# Patient Record
Sex: Male | Born: 1985 | Race: White | Hispanic: No | Marital: Single | Smoking: Never smoker
Health system: Southern US, Community
[De-identification: ages and names within clinical notes are randomized; demographics above are authoritative.]

---

## 2015-08-31 ENCOUNTER — Emergency Department (HOSPITAL_COMMUNITY)
Admission: EM | Admit: 2015-08-31 | Discharge: 2015-08-31 | Disposition: A | Discharge status: Home / Self Care | Payer: Self-pay | Attending: Emergency Medicine | Admitting: Emergency Medicine

## 2015-08-31 ENCOUNTER — Emergency Department (HOSPITAL_COMMUNITY): Payer: Self-pay

## 2015-08-31 ENCOUNTER — Encounter (HOSPITAL_COMMUNITY): Payer: Self-pay | Admitting: Emergency Medicine

## 2015-08-31 DIAGNOSIS — R0789 Other chest pain: Secondary | ICD-10-CM | POA: Insufficient documentation

## 2015-08-31 LAB — BASIC METABOLIC PANEL
ANION GAP: 5 (ref 5–15)
BUN: 10 mg/dL (ref 6–20)
CHLORIDE: 106 mmol/L (ref 101–111)
CO2: 27 mmol/L (ref 22–32)
Calcium: 8.9 mg/dL (ref 8.9–10.3)
Creatinine, Ser: 0.93 mg/dL (ref 0.61–1.24)
GFR calc Af Amer: >60 mL/min (ref >60)
GLUCOSE: 100 mg/dL — AB (ref 65–99)
POTASSIUM: 3.9 mmol/L (ref 3.5–5.1)
SODIUM: 138 mmol/L (ref 135–145)

## 2015-08-31 LAB — D-DIMER, QUANTITATIVE (NOT AT ARMC)

## 2015-08-31 LAB — CBC
HEMATOCRIT: 47.9 % (ref 39.0–52.0)
HEMOGLOBIN: 16.9 g/dL (ref 13.0–17.0)
MCH: 29.6 pg (ref 26.0–34.0)
MCHC: 35.3 g/dL (ref 30.0–36.0)
MCV: 83.9 fL (ref 78.0–100.0)
Platelets: 212 10*3/uL (ref 150–400)
RBC: 5.71 MIL/uL (ref 4.22–5.81)
RDW: 12.2 % (ref 11.5–15.5)
WBC: 9.6 10*3/uL (ref 4.0–10.5)

## 2015-08-31 LAB — I-STAT TROPONIN, ED: Troponin i, poc: 0 ng/mL (ref 0.00–0.08)

## 2015-08-31 MED ORDER — NAPROXEN 250 MG PO TABS: 250.0000 mg | ORAL_TABLET | Freq: Two times a day (BID) | ORAL | 0 refills | Status: AC | PRN

## 2015-08-31 MED ORDER — ACETAMINOPHEN 325 MG PO TABS
650.0000 mg | ORAL_TABLET | Freq: Once | ORAL | Status: AC
Start: 2015-08-31 — End: 2015-08-31
  Administered 2015-08-31: 650 mg via ORAL
  Filled 2015-08-31: qty 2

## 2015-08-31 MED ORDER — METHOCARBAMOL 500 MG PO TABS: 1000.0000 mg | ORAL_TABLET | Freq: Four times a day (QID) | ORAL | 0 refills | Status: AC | PRN

## 2015-08-31 MED ORDER — IBUPROFEN 400 MG PO TABS
400.0000 mg | ORAL_TABLET | Freq: Once | ORAL | Status: AC
  Administered 2015-08-31: 400 mg via ORAL
  Filled 2015-08-31: qty 1

## 2015-08-31 NOTE — ED Notes (Signed)
Pt reports chest pain for several weeks. Tonight he states he was lightheaded and short of breath. He points to mid chest as pain site.

## 2015-08-31 NOTE — ED Provider Notes (Signed)
[facility]EM[facility]PT Provider Note   CSN: [id number] Arrival date & time: 08/31/15  4098  First Provider Contact:  None       History   Chief Complaint Chief Complaint  Patient presents with  . Chest Pain    for several weeks    HPI Stanley Hernandez is a 30 y.o. male.  HPI  Pt was seen at 0715. Per pt, c/o gradual onset and persistence of constant chest "pain" for the past 2+ weeks. Pt describes the CP as "nagging," located at his left parasternal area. Has been associated with SOB. Pt has not taken any medications to treat his symptoms. Denies palpitations, no cough, no abd pain, no N/V/D, no back pain, no fevers, no rash.     History reviewed. No pertinent past medical history.  There are no active problems to display for this patient.   History reviewed. No pertinent surgical history.    Home Medications    Prior to Admission medications   Not on File    Family History History reviewed. No pertinent family history.  Social History Social History  Substance Use Topics  . Smoking status: Never Smoker  . Smokeless tobacco: Never Used  . Alcohol use No     Allergies   Review of patient's allergies indicates no known allergies.   Review of Systems Review of Systems ROS: Statement: All systems negative except as marked or noted in the HPI; Constitutional: Negative for fever and chills. ; ; Eyes: Negative for eye pain, redness and discharge. ; ; ENMT: Negative for ear pain, hoarseness, nasal congestion, sinus pressure and sore throat. ; ; Cardiovascular: Negative for palpitations, diaphoresis, and peripheral edema. ; ; Respiratory: +SOB. Negative for cough, wheezing and stridor. ; ; Gastrointestinal: Negative for nausea, vomiting, diarrhea, abdominal pain, blood in stool, hematemesis, jaundice and rectal bleeding. . ; ; Genitourinary: Negative for dysuria, flank pain and hematuria. ; ; Musculoskeletal: +chest wall pain. Negative for back pain and neck pain.  Negative for swelling and trauma.; ; Skin: Negative for pruritus, rash, abrasions, blisters, bruising and skin lesion.; ; Neuro: Negative for headache, lightheadedness and neck stiffness. Negative for weakness, altered level of consciousness, altered mental status, extremity weakness, paresthesias, involuntary movement, seizure and syncope.      Physical Exam Updated Vital Signs BP 120/94   Pulse 71   Temp 98.1 F (36.7 C) (Oral)   Resp 24   Ht  (1.778 m)   Wt 230 lb (104.3 kg)   SpO2 100%   BMI 33.00 kg/m   Physical Exam 0720: Physical examination:  Nursing notes reviewed; Vital signs and O2 SAT reviewed;  Constitutional: Well developed, Well nourished, Well hydrated, In no acute distress; Head:  Normocephalic, atraumatic; Eyes: EOMI, PERRL, No scleral icterus; ENMT: Mouth and pharynx normal, Mucous membranes moist; Neck: Supple, Full range of motion, No lymphadenopathy; Cardiovascular: Regular rate and rhythm, No murmur, rub, or gallop; Respiratory: Breath sounds clear & equal bilaterally, No rales, rhonchi, wheezes.  Speaking full sentences with ease, Normal respiratory effort/excursion; Chest: +left lower parasternal area tender to palp. No deformity, no soft tissue crepitus. Movement normal; Abdomen: Soft, Nontender, Nondistended, Normal bowel sounds; Genitourinary: No CVA tenderness; Extremities: Pulses normal, No tenderness, No edema, No calf edema or asymmetry.; Neuro: AA&Ox3, Major CN grossly intact.  Speech clear. No gross focal motor or sensory deficits in extremities.; Skin: Color normal, Warm, Dry.; Psych:  Anxious.     ED Treatments / Results  Labs (all labs ordered are listed,  but only abnormal results are displayed)   EKG  EKG Interpretation  Date/Time:  Sunday August 31 2015 06:09:43 EDT Ventricular Rate:  74 PR Interval:    QRS Duration: 96 QT Interval:  389 QTC Calculation: 432 R Axis:   62 Text Interpretation:  Sinus rhythm Low voltage, precordial leads  No old tracing to compare Confirmed by KNAPP  MD-I, IVA ([id number]) on 08/31/2015 6:22:13 AM       Radiology   Procedures Procedures (including critical care time)  Medications Ordered in ED    Initial Impression / Assessment and Plan / ED Course  I have reviewed the triage vital signs and the nursing notes.  Pertinent labs & imaging results that were available during my care of the patient were reviewed by me and considered in my medical decision making (see chart for details).  MDM Reviewed: nursing note and vitals Interpretation: labs, ECG and x-ray   Results for orders placed or performed during the hospital encounter of 08/31/15  Basic metabolic panel  Result Value Ref Range   Sodium 138 135 - 145 mmol/L   Potassium 3.9 3.5 - 5.1 mmol/L   Chloride 106 101 - 111 mmol/L   CO2 27 22 - 32 mmol/L   Glucose, Bld 100 (H) 65 - 99 mg/dL   BUN 10 6 - 20 mg/dL   Creatinine, Ser 6.040.93 0.61 - 1.24 mg/dL   Calcium 8.9 8.9 - 54.010.3 mg/dL   GFR calc non Af Amer >60 >60 mL/min   GFR calc Af Amer >60 >60 mL/min   Anion gap 5 5 - 15  CBC  Result Value Ref Range   WBC 9.6 4.0 - 10.5 K/uL   RBC 5.71 4.22 - 5.81 MIL/uL   Hemoglobin 16.9 13.0 - 17.0 g/dL   HCT 98.147.9 19.139.0 - 47.852.0 %   MCV 83.9 78.0 - 100.0 fL   MCH 29.6 26.0 - 34.0 pg   MCHC 35.3 30.0 - 36.0 g/dL   RDW 29.512.2 62.111.5 - 30.815.5 %   Platelets 212 150 - 400 K/uL  D-dimer, quantitative  Result Value Ref Range   D-Dimer, Quant <0.27 0.00 - 0.50 ug/mL-FEU  I-stat troponin, ED  Result Value Ref Range   Troponin i, poc 0.00 0.00 - 0.08 ng/mL   Comment 3           Dg Chest 2 View Result Date: 08/31/2015 CLINICAL DATA:  Subacute onset of mid chest pain. Acute onset of shortness of breath and lightheadedness. Initial encounter. EXAM: CHEST  2 VIEW COMPARISON:  None. FINDINGS: The lungs are well-aerated and clear. There is no evidence of focal opacification, pleural effusion or pneumothorax. The heart is normal in size; the mediastinal  contour is within normal limits. No acute osseous abnormalities are seen. IMPRESSION: No acute cardiopulmonary process seen. Electronically Signed   By: Roanna RaiderJeffery  Chang M.D.   On: 08/31/2015 06:57    0820:  Doubt PE as cause for symptoms with normal d-dimer and low risk Wells.  Doubt ACS as cause for symptoms with normal troponin and unchanged EKG from previous after 2 weeks of constant symptoms. Tx symptomatically. Dx and testing d/w pt.  Questions answered.  Verb understanding, agreeable to d/c home with outpt f/u.      Final Clinical Impressions(s) / ED Diagnoses   Final diagnoses:  None    New Prescriptions New Prescriptions   No medications on file     Samuel JesterKathleen Seneca Hoback, DO 09/02/15 1651

## 2015-08-31 NOTE — Discharge Instructions (Signed)
Take the prescriptions as directed. Take over the counter tylenol, as directed on packaging, as needed for discomfort. Apply moist heat or ice to the area(s) of discomfort, for 15 minutes at a time, several times per day for the next few days.  Do not fall asleep on a heating or ice pack.  Call your regular medical doctor on Monday to schedule a follow up appointment this week.  Return to the Emergency Department immediately if worsening.

## 2015-08-31 NOTE — ED Notes (Signed)
Patient transported to X-ray 

## 2015-08-31 NOTE — ED Notes (Signed)
Pt from radiology

## 2016-09-23 ENCOUNTER — Ambulatory Visit (INDEPENDENT_AMBULATORY_CARE_PROVIDER_SITE_OTHER): Payer: Self-pay | Admitting: Orthopaedic Surgery

## 2016-09-23 ENCOUNTER — Encounter (INDEPENDENT_AMBULATORY_CARE_PROVIDER_SITE_OTHER): Payer: Self-pay | Admitting: Orthopaedic Surgery

## 2016-09-23 VITALS — BP 153/75 | HR 75 | Ht 70.0 in | Wt 235.0 lb

## 2016-09-23 DIAGNOSIS — S62337A Displaced fracture of neck of fifth metacarpal bone, left hand, initial encounter for closed fracture: Secondary | ICD-10-CM

## 2016-09-23 NOTE — Progress Notes (Signed)
   Office Visit Note   Patient: Stanley Hernandez           Date of Birth: [birth year]           MRN: [id number] Visit Date: 09/23/2016              Requested by: Stanley referring provider defined for this encounter. PCP: Patient, Stanley Hernandez   Assessment & Plan: Visit Diagnoses:  1. Closed displaced fracture of neck of fifth metacarpal bone of left hand, initial encounter     Plan: Ulnar gutter splint applied with the wrist extended MCPs flexed. I'll recheck him in 3 weeks for slight removal we discussed splint care he'll keep it dry. Splint removal and exam on return we will wait on x-rays.  Follow-Up Instructions: Return in about 3 weeks (around 10/14/2016).   Orders:  Stanley orders of the defined types were placed in this encounter.  Stanley orders of the defined types were placed in this encounter.     Procedures: Stanley procedures performed   Clinical Data: Stanley additional findings.   Subjective: Chief Complaint  Patient presents with  . Left Hand - Pain    HPI 31 year old male had a wall with his left dominant hand in anger suffering a left fifth metacarpal fracture. He is placed in a splint injury date was 09/19/2016 on Sunday. 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 He is using a splint using anti-inflammatories.  Review of Systems patient's been young and healthy Stanley previous injury to his left hand. Stanley previous surgeries or significant hospitalizations.   Objective: Vital Signs: BP (!) 153/75   Pulse 75   Ht 5\' 10"  (1.778 m)   Wt 235 lb (106.6 kg)   BMI 33.72 kg/m   Physical Exam  Constitutional: He is oriented to person, place, and time. He appears well-developed and well-nourished.  HENT:  Head: Normocephalic and atraumatic.  Eyes: Pupils are equal, round, and reactive to light. EOM are normal.  Neck: Stanley tracheal deviation present. Stanley thyromegaly present.  Cardiovascular: Normal rate.   Pulmonary/Chest: Effort normal. He has Stanley wheezes.  Abdominal: Soft. Bowel  sounds are normal.  Musculoskeletal:  Patient is diffuse dorsal hand swelling. There is some ecchymosis laterally over the hyperthenar. Face and the fingertip is intact. Tenderness over the fifth metacarpal neck. Stanley rotational deformity.  Neurological: He is alert and oriented to person, place, and time.  Skin: Skin is warm and dry. Capillary refill takes less than 2 seconds.  Psychiatric: He has a normal mood and affect. His behavior is normal. Judgment and thought content normal.    Ortho Exam  Specialty Comments:  Stanley specialty comments available.  Imaging: Stanley results found.   PMFS History: There are Stanley active problems to display for this patient.  Stanley past medical history on file.  Stanley family history on file.  Stanley past surgical history on file. Social History   Occupational History  . Not on file.   Social History Main Topics  . Smoking status: Never Smoker  . Smokeless tobacco: Never Used  . Alcohol use Stanley  . Drug use: Stanley  . Sexual activity: Not on file

## 2016-10-14 ENCOUNTER — Encounter (INDEPENDENT_AMBULATORY_CARE_PROVIDER_SITE_OTHER): Payer: Self-pay | Admitting: Orthopaedic Surgery

## 2016-10-14 ENCOUNTER — Ambulatory Visit (INDEPENDENT_AMBULATORY_CARE_PROVIDER_SITE_OTHER): Payer: Self-pay

## 2016-10-14 ENCOUNTER — Ambulatory Visit (INDEPENDENT_AMBULATORY_CARE_PROVIDER_SITE_OTHER): Payer: Self-pay | Admitting: Orthopaedic Surgery

## 2016-10-14 VITALS — BP 129/72 | HR 82

## 2016-10-14 DIAGNOSIS — S62337D Displaced fracture of neck of fifth metacarpal bone, left hand, subsequent encounter for fracture with routine healing: Secondary | ICD-10-CM

## 2016-10-14 NOTE — Progress Notes (Signed)
   Office Visit Note   Patient: Stanley Hernandez           Date of Birth: 1985/04/12           MRN: [id number] Visit Date: 10/14/2016              Requested by: No referring provider defined for this encounter. PCP: Patient, No Pcp Per   Assessment & Plan: Visit Diagnoses:  1. Closed displaced fracture of neck of fifth metacarpal bone of left hand with routine healing, subsequent encounter     Plan:  Gutter splint applied. He can work on finger range of motion taping the small finger to the ring finger. 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  Follow-Up Instructions: No Follow-up on file.   Orders:  Orders Placed This Encounter  Procedures  . XR Hand Complete Left   No orders of the defined types were placed in this encounter.     Procedures: No procedures performed   Clinical Data: No additional findings.   Subjective: Chief Complaint  Patient presents with  . Left Hand - Fracture, Follow-up    HPI follow boxer's fracture left hand. Splint is been using his been compliant its removed and has significant wear. The splint applied. Sensation the fingertip is intact he still has some tenderness fracture site.  Review of Systems unchanged from last office visit.   Objective: Vital Signs: BP 129/72   Pulse 82   Physical Exam  Ortho Exam  Specialty Comments:  No specialty comments available.  Imaging: Xr Hand Complete Left  Result Date: 10/14/2016 Three-view x-rays left hand demonstrates fifth metacarpal neck fracture left hand. Interval healing no increase in angulation. Slight soft tissue dorsal swelling noted Impression healing boxer's fracture left hand    PMFS History: There are no active problems to display for this patient.  No past medical history on file.    No family history on file.  No past surgical history on file. Social History   Occupational History  . Not on file.   Social History Main Topics  . Smoking status: Never Smoker  . Smokeless tobacco: Never Used  . Alcohol use No  . Drug use: No  . Sexual activity: Not on file

## 2017-02-14 IMAGING — DX DG CHEST 2V
2 series · 2 of 2 positions shown · non-contrast
Comparison: None.

CLINICAL DATA: Subacute onset of mid chest pain. Acute onset of
shortness of breath and lightheadedness. Initial encounter.

EXAM:
CHEST  2 VIEW

[chest pa]
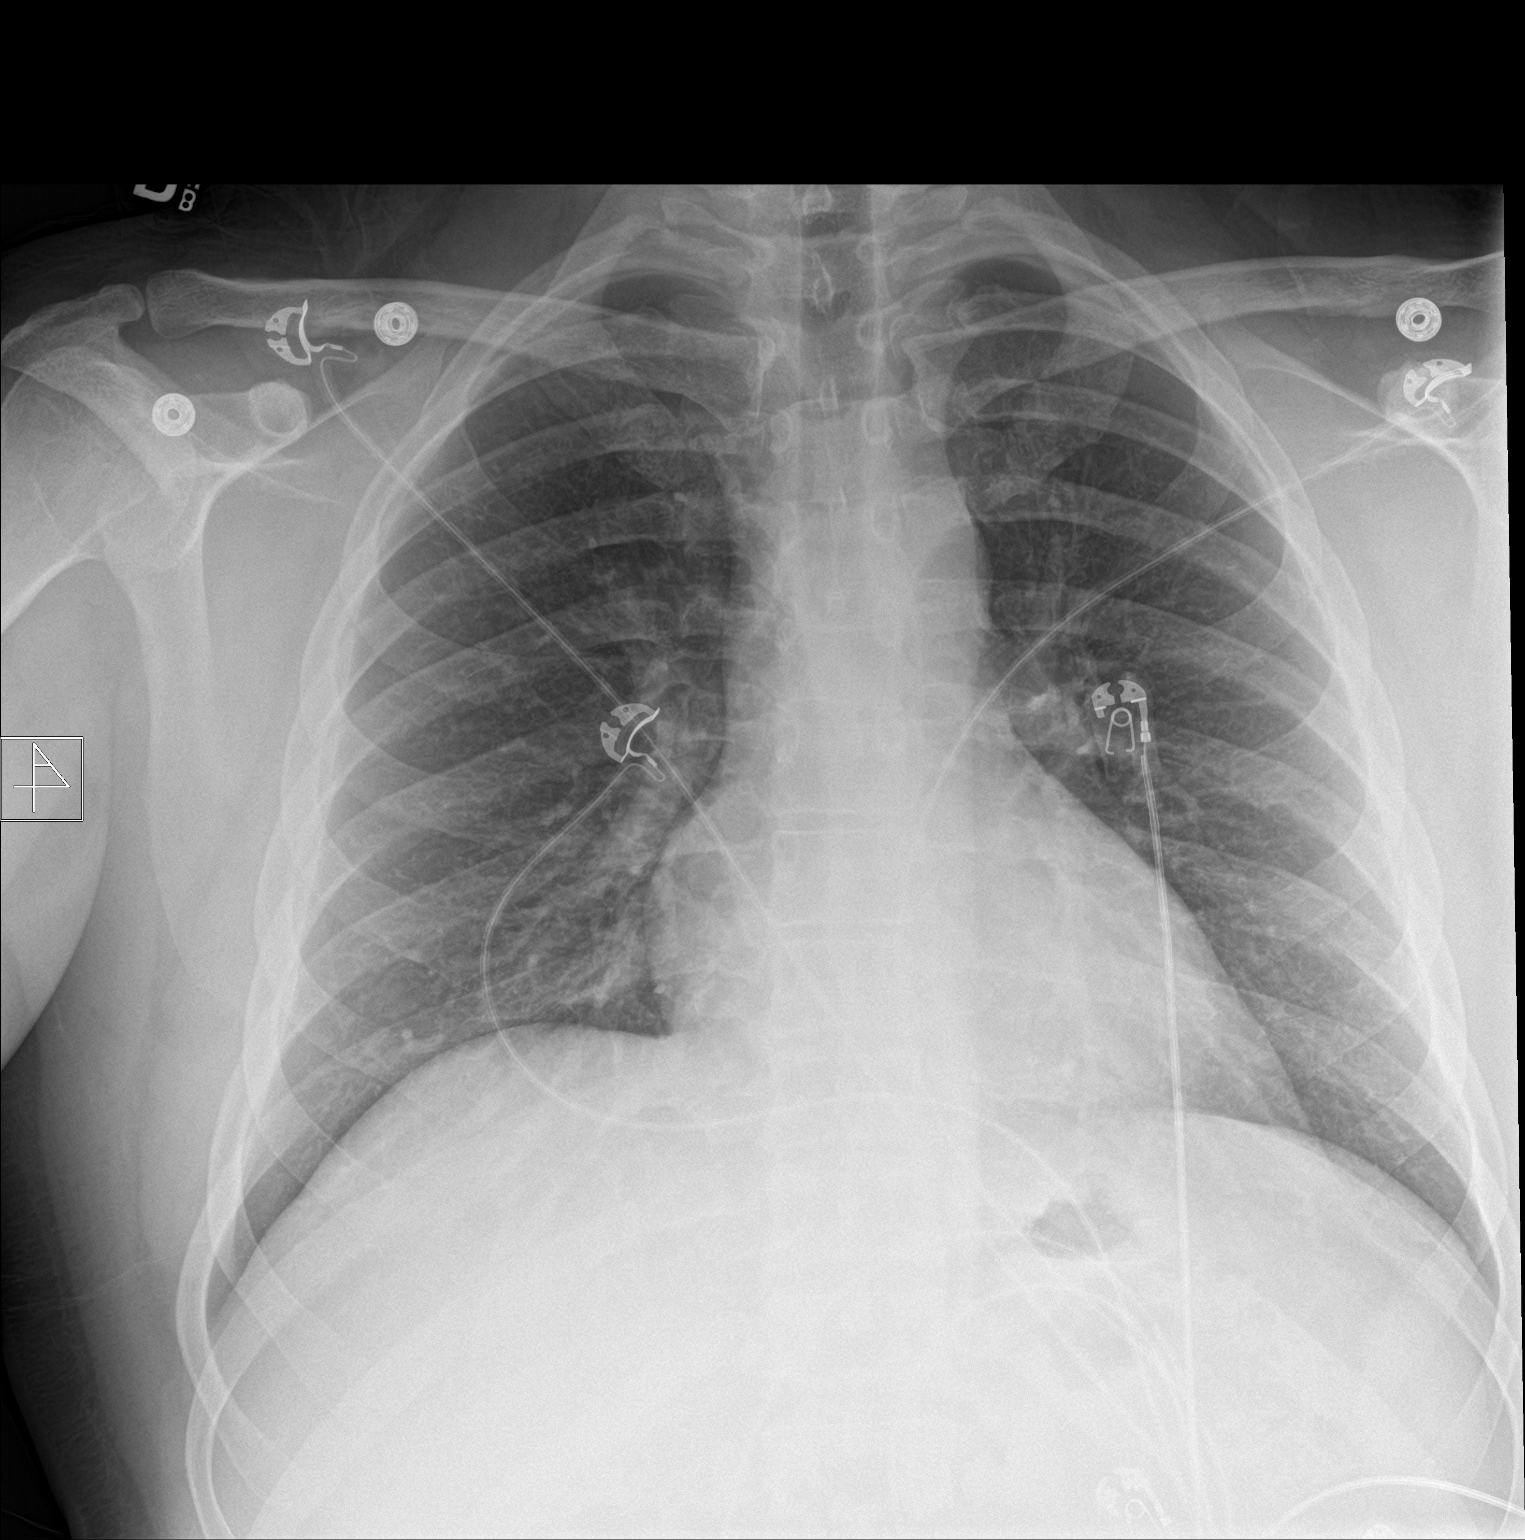

[chest lat]
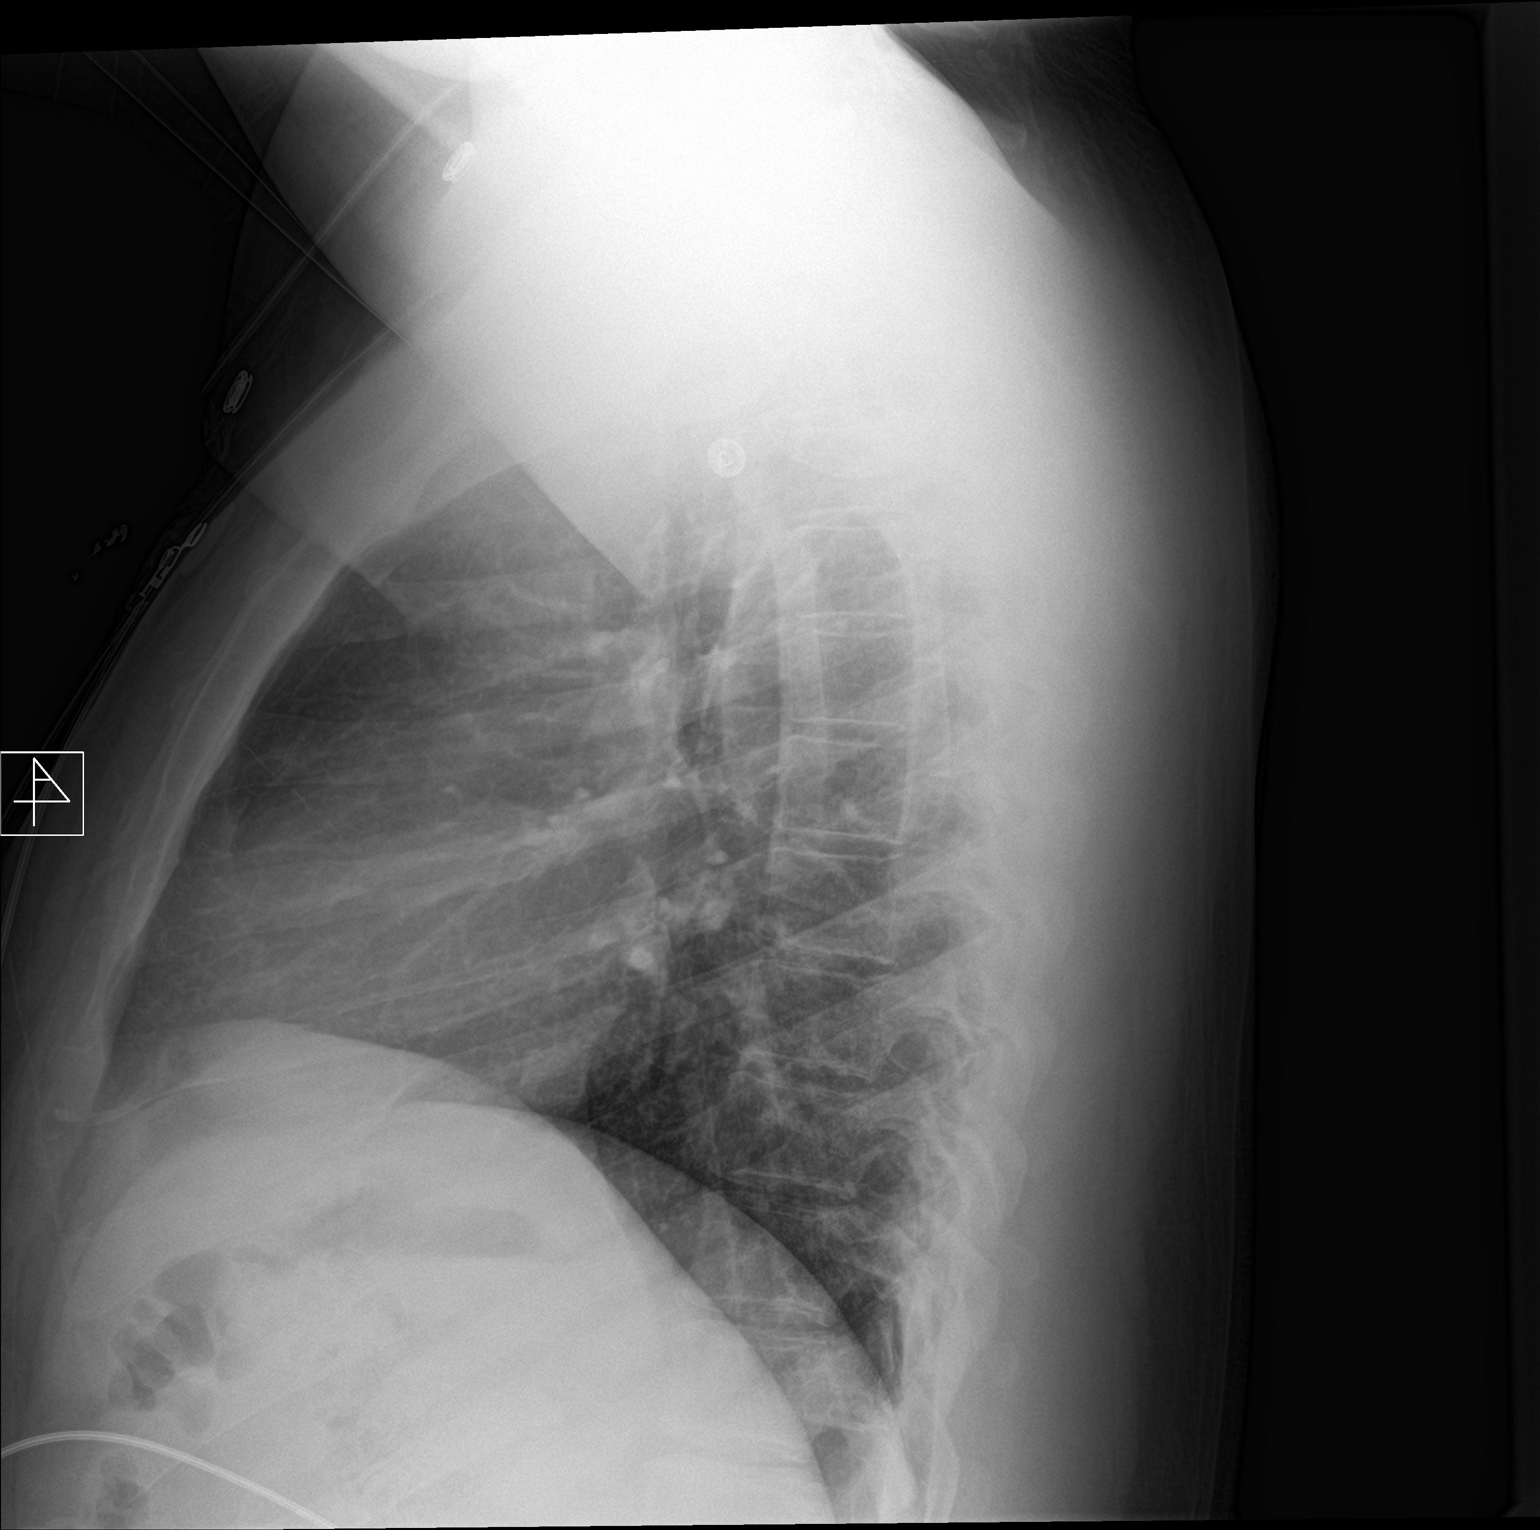

[2 of 2 positions shown; findings below may reference images not displayed]

FINDINGS: The lungs are well-aerated and clear. There is no evidence of focal
opacification, pleural effusion or pneumothorax.

The heart is normal in size; the mediastinal contour is within
normal limits. No acute osseous abnormalities are seen.
IMPRESSION: No acute cardiopulmonary process seen.

## 2022-09-06 ENCOUNTER — Ambulatory Visit: Payer: Medicaid Other | Admitting: Urology

## 2022-09-06 DIAGNOSIS — N50819 Testicular pain, unspecified: Secondary | ICD-10-CM

## 2022-11-03 ENCOUNTER — Other Ambulatory Visit (HOSPITAL_COMMUNITY): Payer: Self-pay | Admitting: Family Medicine

## 2022-11-03 DIAGNOSIS — R1013 Epigastric pain: Secondary | ICD-10-CM

## 2022-11-05 ENCOUNTER — Other Ambulatory Visit (HOSPITAL_BASED_OUTPATIENT_CLINIC_OR_DEPARTMENT_OTHER): Payer: Self-pay

## 2022-11-05 DIAGNOSIS — G4709 Other insomnia: Secondary | ICD-10-CM

## 2022-11-05 DIAGNOSIS — R0681 Apnea, not elsewhere classified: Secondary | ICD-10-CM

## 2022-11-09 ENCOUNTER — Ambulatory Visit (HOSPITAL_COMMUNITY)
Admission: RE | Admit: 2022-11-09 | Discharge: 2022-11-09 | Disposition: A | Discharge status: Home / Self Care | Payer: Medicaid Other | Source: Ambulatory Visit | Attending: Family Medicine | Admitting: Family Medicine

## 2022-11-09 DIAGNOSIS — R1013 Epigastric pain: Secondary | ICD-10-CM | POA: Insufficient documentation
